# Patient Record
Sex: Female | Born: 1955 | Hispanic: No | Marital: Married | Smoking: Never smoker
Health system: Southern US, Community
[De-identification: ages and names within clinical notes are randomized; demographics above are authoritative.]

## PROBLEM LIST (undated history)

## (undated) DIAGNOSIS — M810 Age-related osteoporosis without current pathological fracture: Secondary | ICD-10-CM

## (undated) DIAGNOSIS — E039 Hypothyroidism, unspecified: Secondary | ICD-10-CM

## (undated) DIAGNOSIS — E785 Hyperlipidemia, unspecified: Secondary | ICD-10-CM

## (undated) DIAGNOSIS — Z8639 Personal history of other endocrine, nutritional and metabolic disease: Secondary | ICD-10-CM

## (undated) HISTORY — DX: Personal history of other endocrine, nutritional and metabolic disease: Z86.39

## (undated) HISTORY — DX: Hyperlipidemia, unspecified: E78.5

## (undated) HISTORY — DX: Hypothyroidism, unspecified: E03.9

## (undated) HISTORY — DX: Age-related osteoporosis without current pathological fracture: M81.0

---

## 1997-08-20 ENCOUNTER — Other Ambulatory Visit: Admission: RE | Admit: 1997-08-20 | Discharge: 1997-08-20 | Payer: Self-pay | Admitting: Obstetrics and Gynecology

## 1998-08-23 ENCOUNTER — Other Ambulatory Visit: Admission: RE | Admit: 1998-08-23 | Discharge: 1998-08-23 | Payer: Self-pay | Admitting: Obstetrics and Gynecology

## 1999-09-08 ENCOUNTER — Other Ambulatory Visit: Admission: RE | Admit: 1999-09-08 | Discharge: 1999-09-08 | Payer: Self-pay | Admitting: Obstetrics and Gynecology

## 2000-10-11 ENCOUNTER — Other Ambulatory Visit: Admission: RE | Admit: 2000-10-11 | Discharge: 2000-10-11 | Payer: Self-pay | Admitting: Obstetrics and Gynecology

## 2006-05-21 ENCOUNTER — Encounter: Admission: RE | Admit: 2006-05-21 | Discharge: 2006-05-21 | Payer: Self-pay | Admitting: Endocrinology

## 2006-06-04 ENCOUNTER — Encounter: Admission: RE | Admit: 2006-06-04 | Discharge: 2006-06-04 | Payer: Self-pay | Admitting: Endocrinology

## 2008-01-05 IMAGING — NM NM RAI THERAPY FOR HYPERTHYROIDISM
1 series · 1 of 1 positions shown · non-contrast
Comparison: none

CLINICAL DATA: Graves disease.  
 RADIOACTIVE IODINE THERAPY FOR HYPERTHYROIDISM:
 Procedure:  The risks and benefits of radioactive iodine therapy were discussed with the patient in detail.  Alternative therapies were also mentioned.  Radiation safety was discussed with the patient, including how to protect the general public from exposure.  There were no barriers to communication.  Written consent was obtained.  The patient then received a capsule containing the radiopharmaceutical.  ([id number])
 The patient will follow-up with the referring physician.
 Radiopharmaceutical:  15.24 mCi D-RNR sodium iodide.

[st statics, dual detec · 1 of 1 slices shown]
[im 1/1]
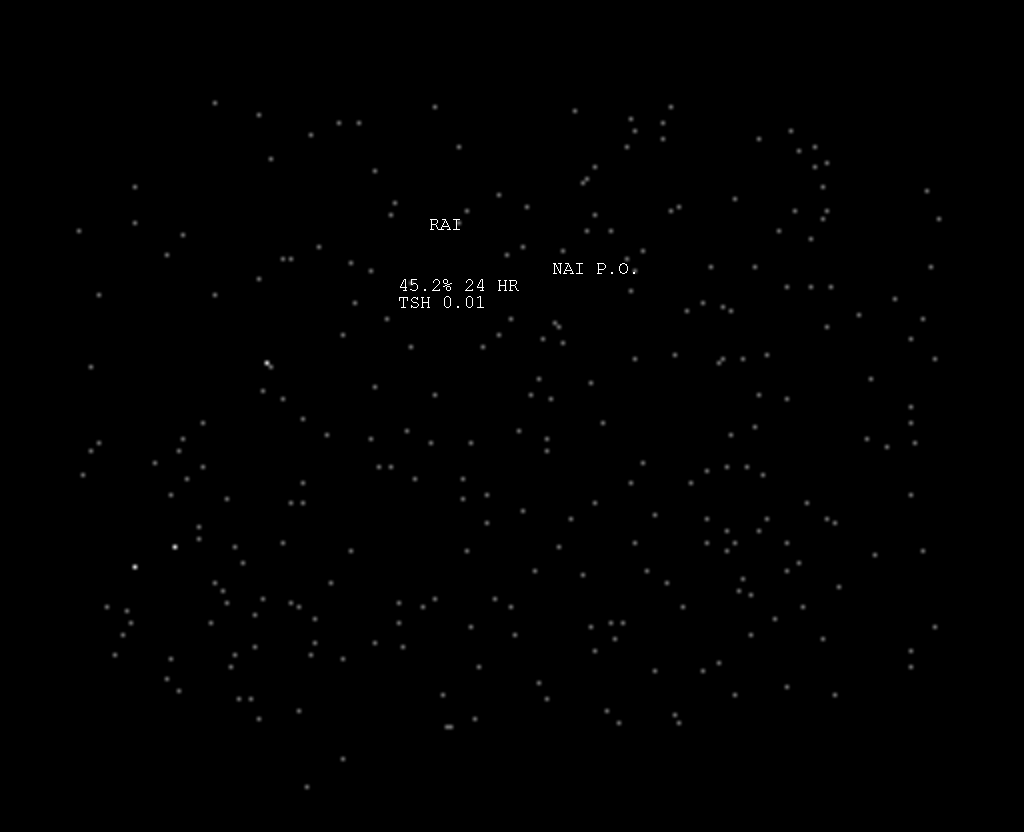

[1 of 1 positions shown; findings below may reference images not displayed]

IMPRESSION: Per oral administration of D-RNR sodium iodide for the treatment of hyperthyroidism.

## 2015-06-04 DIAGNOSIS — E039 Hypothyroidism, unspecified: Secondary | ICD-10-CM | POA: Diagnosis not present

## 2015-06-04 DIAGNOSIS — Z0000 Encounter for general adult medical examination without abnormal findings: Secondary | ICD-10-CM | POA: Diagnosis not present

## 2015-06-04 DIAGNOSIS — Z209 Contact with and (suspected) exposure to unspecified communicable disease: Secondary | ICD-10-CM | POA: Diagnosis not present

## 2015-06-04 DIAGNOSIS — E782 Mixed hyperlipidemia: Secondary | ICD-10-CM | POA: Diagnosis not present

## 2015-07-01 DIAGNOSIS — M8589 Other specified disorders of bone density and structure, multiple sites: Secondary | ICD-10-CM | POA: Diagnosis not present

## 2015-07-16 DIAGNOSIS — Z23 Encounter for immunization: Secondary | ICD-10-CM | POA: Diagnosis not present

## 2015-08-25 DIAGNOSIS — R079 Chest pain, unspecified: Secondary | ICD-10-CM | POA: Diagnosis not present

## 2015-09-08 DIAGNOSIS — Z01419 Encounter for gynecological examination (general) (routine) without abnormal findings: Secondary | ICD-10-CM | POA: Diagnosis not present

## 2015-09-08 DIAGNOSIS — Z681 Body mass index (BMI) 19 or less, adult: Secondary | ICD-10-CM | POA: Diagnosis not present

## 2015-09-08 DIAGNOSIS — Z1231 Encounter for screening mammogram for malignant neoplasm of breast: Secondary | ICD-10-CM | POA: Diagnosis not present

## 2015-09-27 DIAGNOSIS — Z86010 Personal history of colonic polyps: Secondary | ICD-10-CM | POA: Diagnosis not present

## 2015-09-27 DIAGNOSIS — K921 Melena: Secondary | ICD-10-CM | POA: Diagnosis not present

## 2015-09-27 DIAGNOSIS — K5900 Constipation, unspecified: Secondary | ICD-10-CM | POA: Diagnosis not present

## 2015-11-15 DIAGNOSIS — Z86010 Personal history of colonic polyps: Secondary | ICD-10-CM | POA: Diagnosis not present

## 2015-11-15 DIAGNOSIS — R194 Change in bowel habit: Secondary | ICD-10-CM | POA: Diagnosis not present

## 2015-11-15 DIAGNOSIS — K921 Melena: Secondary | ICD-10-CM | POA: Diagnosis not present

## 2015-12-06 DIAGNOSIS — Z23 Encounter for immunization: Secondary | ICD-10-CM | POA: Diagnosis not present

## 2016-01-06 DIAGNOSIS — L509 Urticaria, unspecified: Secondary | ICD-10-CM | POA: Diagnosis not present

## 2016-01-21 DIAGNOSIS — Z23 Encounter for immunization: Secondary | ICD-10-CM | POA: Diagnosis not present

## 2016-06-09 DIAGNOSIS — M859 Disorder of bone density and structure, unspecified: Secondary | ICD-10-CM | POA: Diagnosis not present

## 2016-06-09 DIAGNOSIS — E782 Mixed hyperlipidemia: Secondary | ICD-10-CM | POA: Diagnosis not present

## 2016-06-09 DIAGNOSIS — E039 Hypothyroidism, unspecified: Secondary | ICD-10-CM | POA: Diagnosis not present

## 2016-06-09 DIAGNOSIS — K635 Polyp of colon: Secondary | ICD-10-CM | POA: Diagnosis not present

## 2016-06-09 DIAGNOSIS — Z23 Encounter for immunization: Secondary | ICD-10-CM | POA: Diagnosis not present

## 2016-06-09 DIAGNOSIS — Z0000 Encounter for general adult medical examination without abnormal findings: Secondary | ICD-10-CM | POA: Diagnosis not present

## 2016-09-15 DIAGNOSIS — Z124 Encounter for screening for malignant neoplasm of cervix: Secondary | ICD-10-CM | POA: Diagnosis not present

## 2016-09-15 DIAGNOSIS — Z1231 Encounter for screening mammogram for malignant neoplasm of breast: Secondary | ICD-10-CM | POA: Diagnosis not present

## 2016-09-15 DIAGNOSIS — Z681 Body mass index (BMI) 19 or less, adult: Secondary | ICD-10-CM | POA: Diagnosis not present

## 2016-09-15 DIAGNOSIS — Z01419 Encounter for gynecological examination (general) (routine) without abnormal findings: Secondary | ICD-10-CM | POA: Diagnosis not present

## 2016-09-23 DIAGNOSIS — Z23 Encounter for immunization: Secondary | ICD-10-CM | POA: Diagnosis not present

## 2016-09-27 DIAGNOSIS — Z23 Encounter for immunization: Secondary | ICD-10-CM | POA: Diagnosis not present

## 2017-05-04 DIAGNOSIS — R05 Cough: Secondary | ICD-10-CM | POA: Diagnosis not present

## 2017-05-04 DIAGNOSIS — J069 Acute upper respiratory infection, unspecified: Secondary | ICD-10-CM | POA: Diagnosis not present

## 2017-07-04 DIAGNOSIS — E039 Hypothyroidism, unspecified: Secondary | ICD-10-CM | POA: Diagnosis not present

## 2017-07-04 DIAGNOSIS — E782 Mixed hyperlipidemia: Secondary | ICD-10-CM | POA: Diagnosis not present

## 2017-07-04 DIAGNOSIS — Z131 Encounter for screening for diabetes mellitus: Secondary | ICD-10-CM | POA: Diagnosis not present

## 2017-07-04 DIAGNOSIS — M859 Disorder of bone density and structure, unspecified: Secondary | ICD-10-CM | POA: Diagnosis not present

## 2017-07-04 DIAGNOSIS — Z0000 Encounter for general adult medical examination without abnormal findings: Secondary | ICD-10-CM | POA: Diagnosis not present

## 2017-08-06 DIAGNOSIS — M8588 Other specified disorders of bone density and structure, other site: Secondary | ICD-10-CM | POA: Diagnosis not present

## 2017-08-06 DIAGNOSIS — M810 Age-related osteoporosis without current pathological fracture: Secondary | ICD-10-CM | POA: Diagnosis not present

## 2017-10-05 DIAGNOSIS — Z681 Body mass index (BMI) 19 or less, adult: Secondary | ICD-10-CM | POA: Diagnosis not present

## 2017-10-05 DIAGNOSIS — Z01419 Encounter for gynecological examination (general) (routine) without abnormal findings: Secondary | ICD-10-CM | POA: Diagnosis not present

## 2017-10-05 DIAGNOSIS — Z1231 Encounter for screening mammogram for malignant neoplasm of breast: Secondary | ICD-10-CM | POA: Diagnosis not present

## 2017-10-10 ENCOUNTER — Other Ambulatory Visit: Payer: Self-pay | Admitting: Obstetrics and Gynecology

## 2017-10-10 DIAGNOSIS — R928 Other abnormal and inconclusive findings on diagnostic imaging of breast: Secondary | ICD-10-CM

## 2017-10-15 ENCOUNTER — Ambulatory Visit
Admission: RE | Admit: 2017-10-15 | Discharge: 2017-10-15 | Disposition: A | Discharge status: Home / Self Care | Payer: BLUE CROSS/BLUE SHIELD | Source: Ambulatory Visit | Attending: Obstetrics and Gynecology | Admitting: Obstetrics and Gynecology

## 2017-10-15 DIAGNOSIS — N6489 Other specified disorders of breast: Secondary | ICD-10-CM | POA: Diagnosis not present

## 2017-10-15 DIAGNOSIS — R928 Other abnormal and inconclusive findings on diagnostic imaging of breast: Secondary | ICD-10-CM

## 2017-10-19 DIAGNOSIS — N390 Urinary tract infection, site not specified: Secondary | ICD-10-CM | POA: Diagnosis not present

## 2017-12-17 DIAGNOSIS — Z23 Encounter for immunization: Secondary | ICD-10-CM | POA: Diagnosis not present

## 2017-12-17 DIAGNOSIS — M5416 Radiculopathy, lumbar region: Secondary | ICD-10-CM | POA: Diagnosis not present

## 2017-12-17 DIAGNOSIS — Z8739 Personal history of other diseases of the musculoskeletal system and connective tissue: Secondary | ICD-10-CM | POA: Diagnosis not present

## 2018-01-21 DIAGNOSIS — M545 Low back pain: Secondary | ICD-10-CM | POA: Diagnosis not present

## 2018-01-31 DIAGNOSIS — M545 Low back pain: Secondary | ICD-10-CM | POA: Diagnosis not present

## 2018-07-19 DIAGNOSIS — E039 Hypothyroidism, unspecified: Secondary | ICD-10-CM | POA: Diagnosis not present

## 2018-07-19 DIAGNOSIS — M810 Age-related osteoporosis without current pathological fracture: Secondary | ICD-10-CM | POA: Diagnosis not present

## 2018-10-28 DIAGNOSIS — E782 Mixed hyperlipidemia: Secondary | ICD-10-CM | POA: Diagnosis not present

## 2018-10-28 DIAGNOSIS — Z23 Encounter for immunization: Secondary | ICD-10-CM | POA: Diagnosis not present

## 2018-10-28 DIAGNOSIS — E039 Hypothyroidism, unspecified: Secondary | ICD-10-CM | POA: Diagnosis not present

## 2018-10-28 DIAGNOSIS — Z131 Encounter for screening for diabetes mellitus: Secondary | ICD-10-CM | POA: Diagnosis not present

## 2018-10-28 DIAGNOSIS — E559 Vitamin D deficiency, unspecified: Secondary | ICD-10-CM | POA: Diagnosis not present

## 2019-05-18 IMAGING — US US AXILLARY RIGHT
1 series · 5 of 5 positions shown · non-contrast
Comparison: October 05, 2017

CLINICAL DATA: 62-year-old patient recalled from recent screening
mammogram for evaluation of a possible mass in the right axillary
region.

EXAM:
ULTRASOUND OF THE RIGHT BREAST

[Series 1: us axillary right · 0.06mm/px · 5 of 5 slices shown]
[im 1/5]
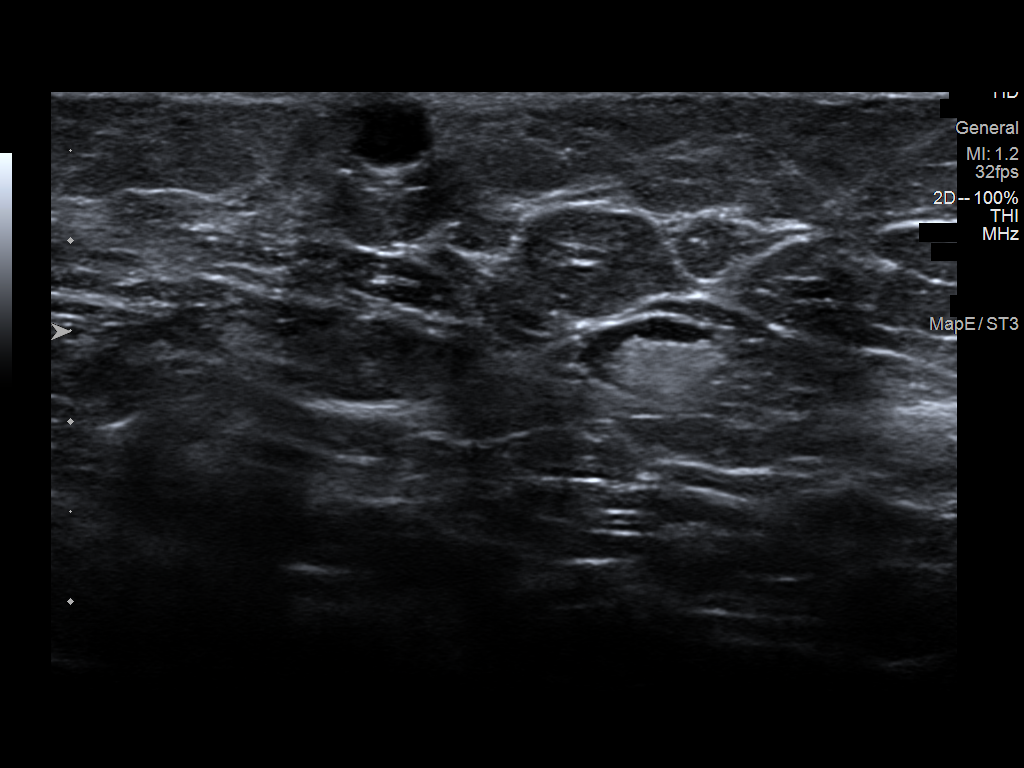
[im 2/5]
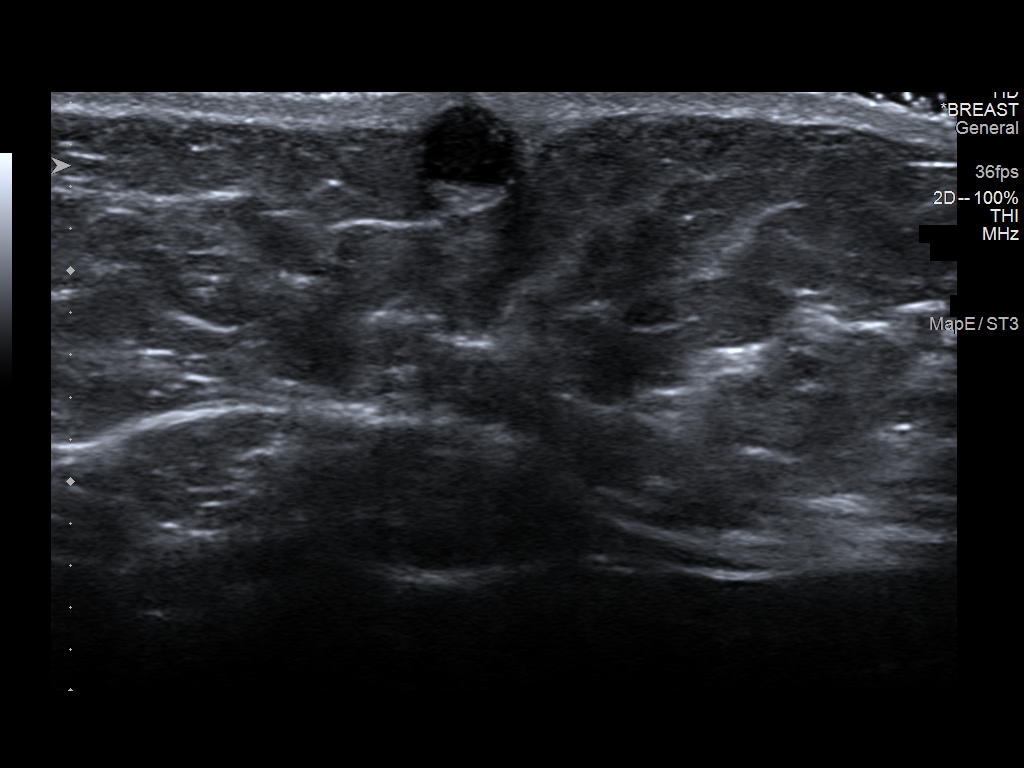
[im 3/5]
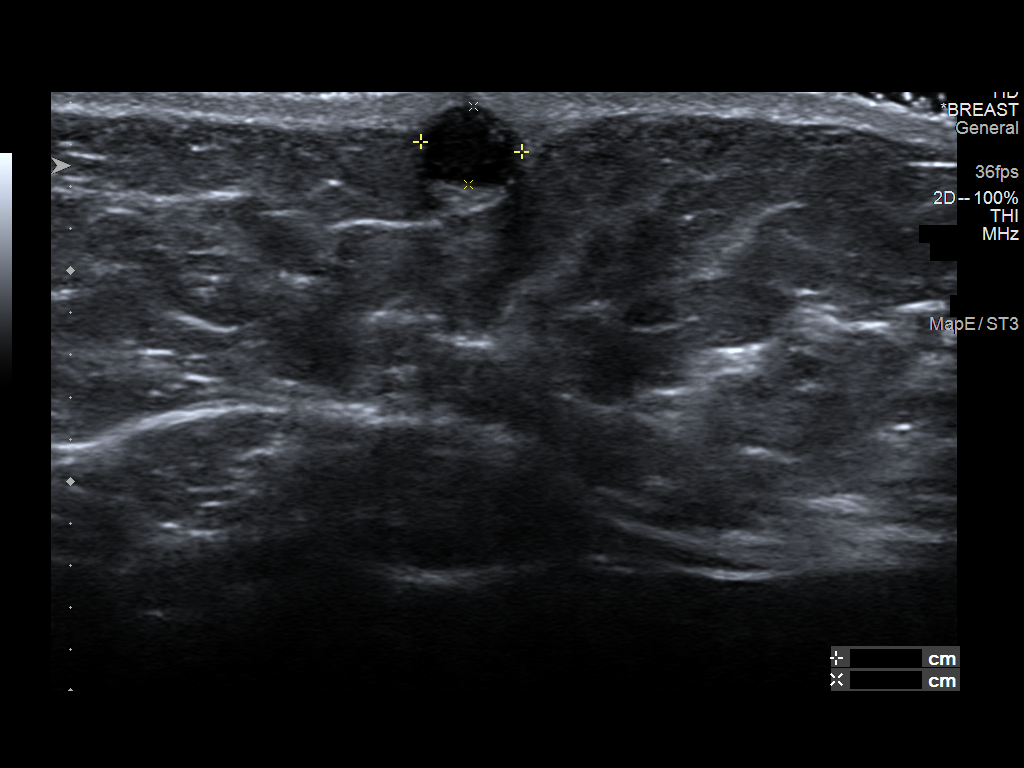
[im 4/5]
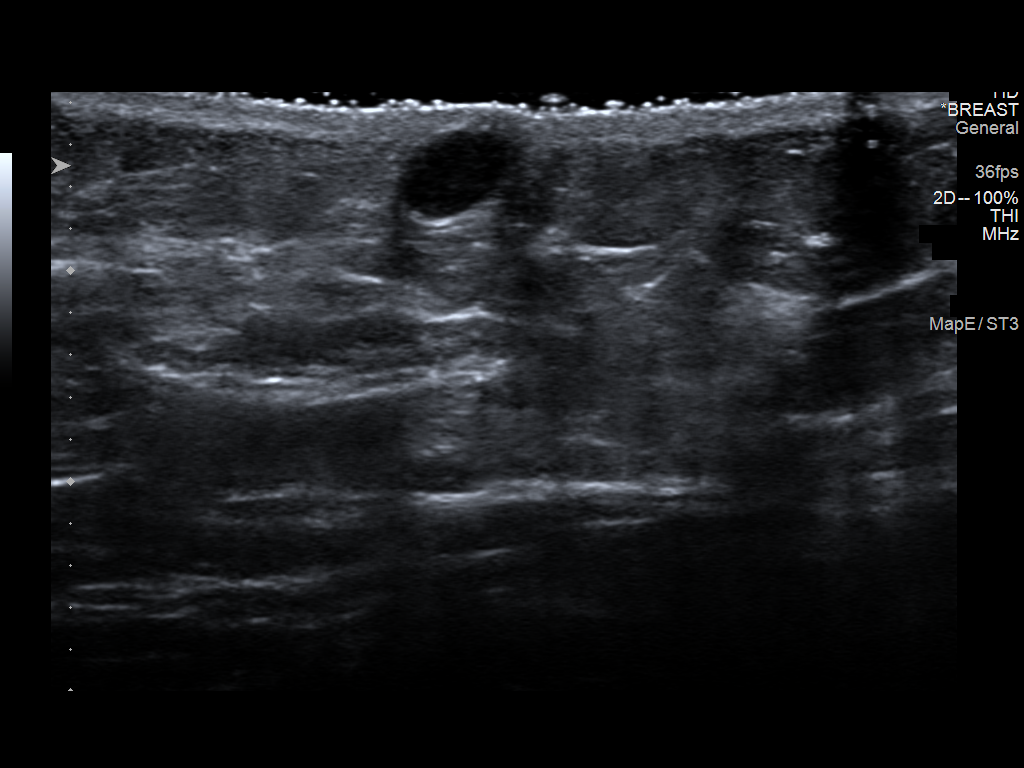
[im 5/5]
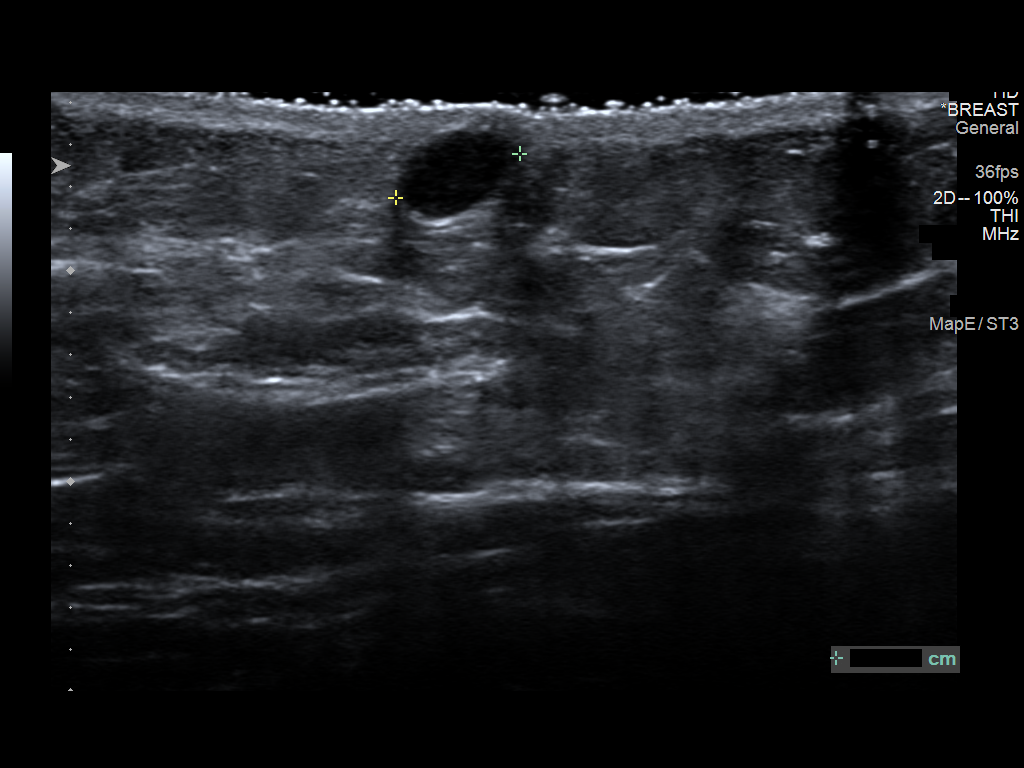

[5 of 5 positions shown; findings below may reference images not displayed]

FINDINGS: On physical exam, there is a superficial raised 5 mm cutaneous
lesion with a visible punctum, suggestive of benign sebaceous cyst.
This is in the inferior right axilla/axillary tail.

Targeted ultrasound is performed, showing mildly complex cyst
centered in the dermis measuring 0.5 x 0.4 x 0.6 cm. A small
hypoechoic tract is seen superficial to the cyst, within the dermis.
The patient states that this mass is chronic, and varies in size
over time. At times, white material has been expressed from the
dermal lesion.
IMPRESSION: Benign sebaceous cyst lower right axilla.

RECOMMENDATION:
Screening mammogram in one year.(Code:[id number])

I have discussed the findings and recommendations with the patient.
Results were also provided in writing at the conclusion of the
visit. If applicable, a reminder letter will be sent to the patient
regarding the next appointment.

BI-RADS CATEGORY  2: Benign.

## 2019-06-10 DIAGNOSIS — M25511 Pain in right shoulder: Secondary | ICD-10-CM | POA: Diagnosis not present

## 2019-08-27 ENCOUNTER — Other Ambulatory Visit: Payer: Self-pay | Admitting: Family Medicine

## 2019-08-27 DIAGNOSIS — M810 Age-related osteoporosis without current pathological fracture: Secondary | ICD-10-CM

## 2019-08-27 DIAGNOSIS — E559 Vitamin D deficiency, unspecified: Secondary | ICD-10-CM | POA: Diagnosis not present

## 2019-08-27 DIAGNOSIS — Z131 Encounter for screening for diabetes mellitus: Secondary | ICD-10-CM | POA: Diagnosis not present

## 2019-08-27 DIAGNOSIS — K635 Polyp of colon: Secondary | ICD-10-CM | POA: Diagnosis not present

## 2019-08-27 DIAGNOSIS — E039 Hypothyroidism, unspecified: Secondary | ICD-10-CM | POA: Diagnosis not present

## 2019-08-27 DIAGNOSIS — E782 Mixed hyperlipidemia: Secondary | ICD-10-CM | POA: Diagnosis not present

## 2019-08-27 DIAGNOSIS — Z0000 Encounter for general adult medical examination without abnormal findings: Secondary | ICD-10-CM | POA: Diagnosis not present

## 2019-08-27 DIAGNOSIS — R0789 Other chest pain: Secondary | ICD-10-CM | POA: Diagnosis not present

## 2019-09-04 DIAGNOSIS — Z681 Body mass index (BMI) 19 or less, adult: Secondary | ICD-10-CM | POA: Diagnosis not present

## 2019-09-04 DIAGNOSIS — Z124 Encounter for screening for malignant neoplasm of cervix: Secondary | ICD-10-CM | POA: Diagnosis not present

## 2019-09-04 DIAGNOSIS — Z01419 Encounter for gynecological examination (general) (routine) without abnormal findings: Secondary | ICD-10-CM | POA: Diagnosis not present

## 2019-09-19 ENCOUNTER — Ambulatory Visit: Payer: BC Managed Care – PPO | Admitting: Cardiology

## 2019-09-19 ENCOUNTER — Encounter: Payer: Self-pay | Admitting: Cardiology

## 2019-09-19 ENCOUNTER — Other Ambulatory Visit: Payer: Self-pay

## 2019-09-19 VITALS — BP 110/76 | HR 60 | Resp 15 | Ht 65.0 in | Wt 116.0 lb

## 2019-09-19 DIAGNOSIS — R0600 Dyspnea, unspecified: Secondary | ICD-10-CM | POA: Diagnosis not present

## 2019-09-19 DIAGNOSIS — E782 Mixed hyperlipidemia: Secondary | ICD-10-CM

## 2019-09-19 DIAGNOSIS — R9431 Abnormal electrocardiogram [ECG] [EKG]: Secondary | ICD-10-CM

## 2019-09-19 DIAGNOSIS — E890 Postprocedural hypothyroidism: Secondary | ICD-10-CM

## 2019-09-19 DIAGNOSIS — R072 Precordial pain: Secondary | ICD-10-CM

## 2019-09-19 DIAGNOSIS — R0609 Other forms of dyspnea: Secondary | ICD-10-CM

## 2019-09-19 MED ORDER — NITROGLYCERIN 0.4 MG SL SUBL: 0.4000 mg | SUBLINGUAL_TABLET | SUBLINGUAL | 0 refills | Status: AC | PRN

## 2019-09-19 MED ORDER — METOPROLOL TARTRATE 25 MG PO TABS: 12.5000 mg | ORAL_TABLET | Freq: Two times a day (BID) | ORAL | 0 refills | Status: DC

## 2019-09-19 MED ORDER — ASPIRIN EC 81 MG PO TBEC: 81.0000 mg | DELAYED_RELEASE_TABLET | Freq: Every day | ORAL | 3 refills | Status: DC

## 2019-09-19 NOTE — Progress Notes (Signed)
Date:  09/19/2019   ID:  Preslyn Violet BaldySim Fergeson, DOB 1955-02-11, MRN [id number]  PCP:  Carrington ClampHorvath, Michelle, MD  Cardiologist:  Tessa LernerSunit Damiano Stamper, DO, [facility] (established care 09/19/2019)  REASON FOR CONSULT: Atypical Chest pain with exertion.  REQUESTING PHYSICIAN:  Carrington ClampHorvath, Michelle, MD [address] [place] [id number]  Chief Complaint  Patient presents with  . New Patient (Initial Visit)  . Chest Pain    HPI  Theo Sim Modesto CharonWong is a 64 y.o. female who presents to the office with a chief complaint of " chest pain." She is referred to the office at the request of Carrington ClampHorvath, Michelle, MD. Patient's past medical history and cardiovascular risk factors include: Mixed hyperlipidemia, history of Graves' disease status post radioactive iodine ablation hypothyroidism, postmenopausal female.  Patient is referred to the office for evaluation of chest discomfort.  Patient states that she is been having chest discomfort for at least 1 year now but the episodes initially were once or twice a month.  However recently they have been more progressive.  She is feels a sharp sensation above and below her left breast, 6 out of 10 intensity, last for about 10 minutes, self-limited.  At times it is brought on by effort related activities and does resolve with rest but not always.  Patient states that about a month and a half ago she was helping her husband mow the lawn and after pushing a mower for short distance she was diaphoretic.  She still did not seek medical attention at that time.  Patient does not take any medications when she has chest discomfort.  Her home blood pressures usually range between 110-125 mmHg and diastolic blood pressure usually in the 70mmHg range.  Patient continues to walk 3-4 times a week at a local park for 30 to 45 minutes.  Patient states that she still able to walk the same distance but it is taking her walk and not able to walk as fast as she did before.  Independently reviewed  the lipid profile received from her PCPs office results noted below.  Denies prior history of coronary artery disease, myocardial infarction, congestive heart failure, deep venous thrombosis, pulmonary embolism, stroke, transient ischemic attack.  FUNCTIONAL STATUS: Walks atleast 3 days a week at a local park for 30-1445minutes.   ALLERGIES: No Known Allergies  MEDICATION LIST PRIOR TO VISIT: Current Meds  Medication Sig  . alendronate (FOSAMAX) 70 MG tablet Take 70 mg by mouth once a week.  . Cholecalciferol (VITAMIN D3) 50 MCG (2000 UT) TABS Take by mouth in the morning and at bedtime.  Marland Kitchen. levothyroxine (SYNTHROID) 88 MCG tablet Take 88 mcg by mouth every morning.     PAST MEDICAL HISTORY: Past Medical History:  Diagnosis Date  . Hx of Graves' disease   . Hyperlipidemia   . Hypothyroidism   . Osteoporosis     PAST SURGICAL HISTORY: History reviewed. No pertinent surgical history.  FAMILY HISTORY: The patient family history is not on file.  SOCIAL HISTORY:  The patient  reports that she has never smoked. She has never used smokeless tobacco. She reports current drug use. Drug: Morphine. She reports that she does not drink alcohol.  REVIEW OF SYSTEMS: Review of Systems  Constitutional: Negative for chills and fever.  HENT: Negative for hoarse voice and nosebleeds.   Eyes: Negative for discharge, double vision and pain.  Cardiovascular: Positive for chest pain (see above. ) and dyspnea on exertion. Negative for claudication, leg swelling,  near-syncope, orthopnea, palpitations, paroxysmal nocturnal dyspnea and syncope.  Respiratory: Negative for hemoptysis and shortness of breath.   Musculoskeletal: Negative for muscle cramps and myalgias.  Gastrointestinal: Negative for abdominal pain, constipation, diarrhea, hematemesis, hematochezia, melena, nausea and vomiting.  Neurological: Negative for dizziness and light-headedness.   PHYSICAL EXAM: Vitals with BMI 09/19/2019  Height  5\' 5"   Weight 116 lbs  BMI 19.3  Systolic 110  Diastolic 76  Pulse 60   CONSTITUTIONAL: Well-developed and well-nourished. No acute distress.  SKIN: Skin is warm and dry. No rash noted. No cyanosis. No pallor. No jaundice HEAD: Normocephalic and atraumatic.  EYES: No scleral icterus MOUTH/THROAT: Moist oral membranes.  NECK: No JVD present. No thyromegaly noted. No carotid bruits  LYMPHATIC: No visible cervical adenopathy.  CHEST Normal respiratory effort. No intercostal retractions  LUNGS: Clear to auscultation bilaterally.  No stridor. No wheezes. No rales.  CARDIOVASCULAR: Regular rate and rhythm, positive S1-S2, no murmurs rubs or gallops appreciated ABDOMINAL: No apparent ascites.  EXTREMITIES: No peripheral edema  HEMATOLOGIC: No significant bruising NEUROLOGIC: Oriented to person, place, and time. Nonfocal. Normal muscle tone.  PSYCHIATRIC: Normal mood and affect. Normal behavior. Cooperative  CARDIAC DATABASE: EKG: 09/19/2019: Sinus bradycardia, 58 bpm normal axis, old anterior infarct, T wave inversions anteroseptal leads suggestive of underlying ischemia, without underlying injury pattern.  Echocardiogram: None  Stress Testing: None  Heart Catheterization: None  LABORATORY DATA: No flowsheet data found.  No flowsheet data found.  Lipid Panel  No results found for: CHOL, TRIG, HDL, CHOLHDL, VLDL, LDLCALC, LDLDIRECT, LABVLDL  No components found for: NTPROBNP No results for input(s): PROBNP in the last 8760 hours. No results for input(s): TSH in the last 8760 hours.  BMP No results for input(s): NA, K, CL, CO2, GLUCOSE, BUN, CREATININE, CALCIUM, GFRNONAA, GFRAA in the last 8760 hours.  HEMOGLOBIN A1C No results found for: HGBA1C, MPG   External Labs: Collected: August 27, 2019 Lipid profile: Total cholesterol 217, triglycerides 156, HDL 51, LDL 138, non-HDL 166 TSH: August 29, 2019  IMPRESSION:    ICD-10-CM   1. Precordial chest pain  R07.2 EKG 12-Lead    PCV  ECHOCARDIOGRAM COMPLETE    metoprolol tartrate (LOPRESSOR) 25 MG tablet    aspirin EC 81 MG tablet    nitroGLYCERIN (NITROSTAT) 0.4 MG SL tablet    Basic metabolic panel  2. Dyspnea on exertion  R06.00 PCV ECHOCARDIOGRAM COMPLETE    CT CORONARY MORPH W/CTA COR W/SCORE W/CA W/CM &/OR WO/CM    CT CORONARY FRACTIONAL FLOW RESERVE DATA PREP    CT CORONARY FRACTIONAL FLOW RESERVE FLUID ANALYSIS    metoprolol tartrate (LOPRESSOR) 25 MG tablet    aspirin EC 81 MG tablet    nitroGLYCERIN (NITROSTAT) 0.4 MG SL tablet    Basic metabolic panel  3. Nonspecific abnormal electrocardiogram (ECG) (EKG)  R94.31   4. 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 female whose past medical history and cardiac risk factors include: Mixed hyperlipidemia, history of Graves' disease status post radioactive iodine ablation hypothyroidism, postmenopausal female.  Precordial chest pain:  Patient symptoms of chest discomfort has both typical and atypical features.  At times she also has effort related dyspnea.  She had one episode of diaphoresis with exertion more than a month ago.  EKG shows sinus bradycardia with possible old anterior infarct with possible anteroseptal ischemia.  Echocardiogram will be ordered to evaluate for structural heart disease and  left ventricular systolic function.  Patient will be scheduled for coronary CTA with possible FFR.  No active chest pain at today's office visit or anginal equivalent.  Patient is asked to seek medical attention to the closest ER via EMS if her symptoms increase in intensity, frequency, duration or has typical chest pain as discussed at today's visit.  Patient verbalizes understanding.  For now we will start aspirin 81 mg p.o. daily until the work-up is complete.  Patient also been prescribed sublingual nitroglycerin tablets to use on a as needed basis.  Patient's ventricular rate is  already very well controlled.  Prior to coronary CTA recommend Lopressor 12.5 mg p.o. twice daily to be started 2 days prior to the study.  Check BMP prior to CT scan.  Dyspnea on exertion: See above  Mixed hyperlipidemia:  Patient is currently not on statin therapy and wishes not to initiate pharmacological therapy.  Estimated 10-year risk of ASCVD less than 5%.  Educated on the importance of dietary changes.  FINAL MEDICATION LIST END OF ENCOUNTER: Meds ordered this encounter  Medications  . metoprolol tartrate (LOPRESSOR) 25 MG tablet    Sig: Take 0.5 tablets (12.5 mg total) by mouth 2 (two) times daily for 6 days. Hold if systolic blood pressure (top blood pressure number) less than 100 mmHg or heart rate less than 60 bpm (pulse).    Dispense:  6 tablet    Refill:  0  . aspirin EC 81 MG tablet    Sig: Take 1 tablet (81 mg total) by mouth daily. Swallow whole.    Dispense:  90 tablet    Refill:  3  . nitroGLYCERIN (NITROSTAT) 0.4 MG SL tablet    Sig: Place 1 tablet (0.4 mg total) under the tongue every 5 (five) minutes as needed for chest pain. If you require more than two tablets five minutes apart go to the nearest ER via EMS.    Dispense:  30 tablet    Refill:  0    There are no discontinued medications.   Current Outpatient Medications:  .  alendronate (FOSAMAX) 70 MG tablet, Take 70 mg by mouth once a week., Disp: , Rfl:  .  Cholecalciferol (VITAMIN D3) 50 MCG (2000 UT) TABS, Take by mouth in the morning and at bedtime., Disp: , Rfl:  .  levothyroxine (SYNTHROID) 88 MCG tablet, Take 88 mcg by mouth every morning., Disp: , Rfl:  .  aspirin EC 81 MG tablet, Take 1 tablet (81 mg total) by mouth daily. Swallow whole., Disp: 90 tablet, Rfl: 3 .  metoprolol tartrate (LOPRESSOR) 25 MG tablet, Take 0.5 tablets (12.5 mg total) by mouth 2 (two) times daily for 6 days. Hold if systolic blood pressure (top blood pressure number) less than 100 mmHg or heart rate less than 60 bpm  (pulse)., Disp: 6 tablet, Rfl: 0 .  nitroGLYCERIN (NITROSTAT) 0.4 MG SL tablet, Place 1 tablet (0.4 mg total) under the tongue every 5 (five) minutes as needed for chest pain. If you require more than two tablets five minutes apart go to the nearest ER via EMS., Disp: 30 tablet, Rfl: 0  Orders Placed This Encounter  Procedures  . CT CORONARY MORPH W/CTA COR W/SCORE W/CA W/CM &/OR WO/CM  . CT CORONARY FRACTIONAL FLOW RESERVE DATA PREP  . CT CORONARY FRACTIONAL FLOW RESERVE FLUID ANALYSIS  . Basic metabolic panel  . EKG 12-Lead  . PCV ECHOCARDIOGRAM COMPLETE    There are no Patient Instructions on file for this  visit.   --Continue cardiac medications as reconciled in final medication list. --Return in about 4 weeks (around 10/17/2019) for re-evaluation of symptoms of chest pain. , review test results.. Or sooner if needed. --Continue follow-up with your primary care physician regarding the management of your other chronic comorbid conditions.  Patient's questions and concerns were addressed to her satisfaction. She voices understanding of the instructions provided during this encounter.   This note was created using a voice recognition software as a result there may be grammatical errors inadvertently enclosed that do not reflect the nature of this encounter. Every attempt is made to correct such errors.  Tessa Lerner, [place], [facility]  Pager: [phone] Office: [phone]

## 2019-09-20 LAB — BASIC METABOLIC PANEL
BUN/Creatinine Ratio: 41 — ABNORMAL HIGH (ref 12–28)
BUN: 15 mg/dL (ref 8–27)
CO2: 27 mmol/L (ref 20–29)
Calcium: 9.2 mg/dL (ref 8.7–10.3)
Chloride: 101 mmol/L (ref 96–106)
Creatinine, Ser: 0.37 mg/dL — ABNORMAL LOW (ref 0.57–1.00)
GFR calc Af Amer: 132 mL/min/{1.73_m2} (ref >59)
GFR calc non Af Amer: 115 mL/min/{1.73_m2} (ref >59)
Glucose: 97 mg/dL (ref 65–99)
Potassium: 4.2 mmol/L (ref 3.5–5.2)
Sodium: 139 mmol/L (ref 134–144)

## 2019-09-23 ENCOUNTER — Other Ambulatory Visit: Payer: Self-pay | Admitting: Cardiology

## 2019-09-23 ENCOUNTER — Telehealth (HOSPITAL_COMMUNITY): Payer: Self-pay | Admitting: Emergency Medicine

## 2019-09-23 DIAGNOSIS — R0600 Dyspnea, unspecified: Secondary | ICD-10-CM

## 2019-09-23 DIAGNOSIS — R9431 Abnormal electrocardiogram [ECG] [EKG]: Secondary | ICD-10-CM

## 2019-09-23 DIAGNOSIS — R0609 Other forms of dyspnea: Secondary | ICD-10-CM

## 2019-09-23 NOTE — Telephone Encounter (Signed)
Attempted to call patient regarding upcoming cardiac CT appointment. °Left message on voicemail with name and callback number °Kahron Kauth RN Navigator Cardiac Imaging °[facility] °[phone] Office °[phone] Cell ° °

## 2019-09-23 NOTE — Progress Notes (Signed)
Left voicemail for patient to call back for lab results.   Copy of labs sent to PCP.

## 2019-09-25 ENCOUNTER — Ambulatory Visit (HOSPITAL_COMMUNITY): Payer: BC Managed Care – PPO

## 2019-10-03 ENCOUNTER — Other Ambulatory Visit: Payer: BC Managed Care – PPO

## 2019-10-17 ENCOUNTER — Ambulatory Visit: Payer: BC Managed Care – PPO | Admitting: Cardiology

## 2019-10-20 ENCOUNTER — Other Ambulatory Visit: Payer: BC Managed Care – PPO

## 2019-10-23 ENCOUNTER — Ambulatory Visit: Payer: BC Managed Care – PPO | Admitting: Cardiology

## 2019-11-19 ENCOUNTER — Ambulatory Visit
Admission: RE | Admit: 2019-11-19 | Discharge: 2019-11-19 | Disposition: A | Discharge status: Home / Self Care | Payer: BC Managed Care – PPO | Source: Ambulatory Visit | Attending: Family Medicine | Admitting: Family Medicine

## 2019-11-19 ENCOUNTER — Other Ambulatory Visit: Payer: Self-pay

## 2019-11-19 DIAGNOSIS — Z780 Asymptomatic menopausal state: Secondary | ICD-10-CM | POA: Diagnosis not present

## 2019-11-19 DIAGNOSIS — M810 Age-related osteoporosis without current pathological fracture: Secondary | ICD-10-CM

## 2019-11-19 DIAGNOSIS — M8589 Other specified disorders of bone density and structure, multiple sites: Secondary | ICD-10-CM | POA: Diagnosis not present

## 2019-11-21 DIAGNOSIS — H5203 Hypermetropia, bilateral: Secondary | ICD-10-CM | POA: Diagnosis not present

## 2019-11-21 DIAGNOSIS — H25013 Cortical age-related cataract, bilateral: Secondary | ICD-10-CM | POA: Diagnosis not present

## 2019-11-24 ENCOUNTER — Other Ambulatory Visit: Payer: Self-pay

## 2019-11-24 ENCOUNTER — Ambulatory Visit: Payer: BC Managed Care – PPO

## 2019-11-24 DIAGNOSIS — R0609 Other forms of dyspnea: Secondary | ICD-10-CM

## 2019-11-24 DIAGNOSIS — R9431 Abnormal electrocardiogram [ECG] [EKG]: Secondary | ICD-10-CM

## 2019-11-24 DIAGNOSIS — R0600 Dyspnea, unspecified: Secondary | ICD-10-CM

## 2019-11-25 ENCOUNTER — Ambulatory Visit: Payer: BC Managed Care – PPO

## 2019-11-25 DIAGNOSIS — R0600 Dyspnea, unspecified: Secondary | ICD-10-CM

## 2019-11-25 DIAGNOSIS — R072 Precordial pain: Secondary | ICD-10-CM

## 2019-11-25 DIAGNOSIS — R0609 Other forms of dyspnea: Secondary | ICD-10-CM | POA: Diagnosis not present

## 2019-12-01 NOTE — Progress Notes (Signed)
Called pt no answer, left a vm about results.

## 2019-12-08 ENCOUNTER — Ambulatory Visit: Payer: BC Managed Care – PPO | Admitting: Cardiology

## 2019-12-08 ENCOUNTER — Encounter: Payer: Self-pay | Admitting: Cardiology

## 2019-12-08 ENCOUNTER — Other Ambulatory Visit: Payer: Self-pay

## 2019-12-08 VITALS — BP 106/62 | HR 66 | Resp 15 | Ht 65.0 in | Wt 115.0 lb

## 2019-12-08 DIAGNOSIS — R072 Precordial pain: Secondary | ICD-10-CM

## 2019-12-08 DIAGNOSIS — Z712 Person consulting for explanation of examination or test findings: Secondary | ICD-10-CM

## 2019-12-08 DIAGNOSIS — E782 Mixed hyperlipidemia: Secondary | ICD-10-CM

## 2019-12-08 NOTE — Progress Notes (Signed)
Date:  12/08/2019   ID:  Denise Rose, DOB 02/01/1956, MRN [id number]  PCP:  Carrington Clamp, MD  Cardiologist:  Tessa Lerner, DO, [facility] (established care 09/19/2019)  Date: 12/08/19 Last Office Visit: 09/19/2019  Chief Complaint  Patient presents with  . Follow-up    4 week  . re-evaluation of CP  . Results    HPI  Denise Rose is a 64 y.o. female who presents to the office with a chief complaint of " reevaluation of chest pain and review test results." She is referred to the office at the request of Carrington Clamp, MD. Patient's past medical history and cardiovascular risk factors include: Mixed hyperlipidemia, history of Graves' disease status post radioactive iodine ablation hypothyroidism, postmenopausal female.  Patient is referred to the office for evaluation of chest discomfort.  At the last office visit patient noted symptoms of precordial chest pain which had both typical and atypical features in addition to effort related dyspnea.  She was recommended to undergo coronary CTA with possible CT FFR.  However, due to limitations from her insurance company she did not undergo coronary CTA.  She underwent echocardiogram and stress test since last office visit.  Clinically patient states that she is doing well from the last visit.  She has not had recurrence of chest pain or shortness of breath either at rest or with walking.  She has not required the use of sublingual nitroglycerin tablets.  Echocardiogram noted preserved LVEF with mild valvular heart disease.  An exercise nuclear stress test noted normal myocardial perfusion.  Denies prior history of coronary artery disease, myocardial infarction, congestive heart failure, deep venous thrombosis, pulmonary embolism, stroke, transient ischemic attack.  FUNCTIONAL STATUS: [place]least 3 [place] for [place]-24minutes.   ALLERGIES: No Known Allergies  MEDICATION LIST PRIOR TO VISIT: Current Meds  Medication  Sig  . alendronate (FOSAMAX) 70 MG tablet Take 70 mg by mouth once a week.  . Calcium Carb-Cholecalciferol (CALCIUM 500 + D) 500-125 MG-UNIT TABS Take 600 mg by mouth.  . Cholecalciferol (VITAMIN D3) 50 MCG (2000 UT) TABS Take by mouth in the morning and at bedtime.  Marland Kitchen levothyroxine (SYNTHROID) 88 MCG tablet Take 88 mcg by mouth every morning.  . nitroGLYCERIN (NITROSTAT) 0.4 MG SL tablet Place 1 tablet (0.4 mg total) under the tongue every 5 (five) minutes as needed for chest pain. If you require more than two tablets five minutes apart go to the nearest ER via EMS.     PAST MEDICAL HISTORY: Past Medical History:  Diagnosis Date  . Hx of Graves' disease   . Hyperlipidemia   . Hypothyroidism   . Osteoporosis     PAST SURGICAL HISTORY: History reviewed. No pertinent surgical history. None   FAMILY HISTORY: The patient family history is not on file. No family history of premature coronary disease or sudden cardiac death.  SOCIAL HISTORY:  The patient  reports that she has never smoked. She has never used smokeless tobacco. She reports current drug use. Drug: Morphine. She reports that she does not drink alcohol.  REVIEW OF SYSTEMS: Review of Systems  Constitutional: Negative for chills and fever.  HENT: Negative for hoarse voice and nosebleeds.   Eyes: Negative for discharge, double vision and pain.  Cardiovascular: Negative for chest pain, claudication, dyspnea on exertion, leg swelling, near-syncope, orthopnea, palpitations, paroxysmal nocturnal dyspnea and syncope.  Respiratory: Negative for hemoptysis and shortness of breath.   Musculoskeletal: Negative for muscle cramps  and myalgias.  Gastrointestinal: Negative for abdominal pain, constipation, diarrhea, hematemesis, hematochezia, melena, nausea and vomiting.  Neurological: Negative for dizziness and light-headedness.   PHYSICAL EXAM: Vitals with BMI 12/08/2019 09/19/2019  Height 5\' 5"  5\' 5"   Weight 115 lbs 116 lbs  BMI  19.14 19.3  Systolic 106 110  Diastolic 62 76  Pulse 66 60   CONSTITUTIONAL: Well-developed and well-nourished. No acute distress.  SKIN: Skin is warm and dry. No rash noted. No cyanosis. No pallor. No jaundice HEAD: Normocephalic and atraumatic.  EYES: No scleral icterus MOUTH/THROAT: Moist oral membranes.  NECK: No JVD present. No thyromegaly noted. No carotid bruits  LYMPHATIC: No visible cervical adenopathy.  CHEST Normal respiratory effort. No intercostal retractions  LUNGS: Clear to auscultation bilaterally.  No stridor. No wheezes. No rales.  CARDIOVASCULAR: Regular rate and rhythm, positive S1-S2, no murmurs rubs or gallops appreciated ABDOMINAL: No apparent ascites.  EXTREMITIES: No peripheral edema  HEMATOLOGIC: No significant bruising NEUROLOGIC: Oriented to person, place, and time. Nonfocal. Normal muscle tone.  PSYCHIATRIC: Normal mood and affect. Normal behavior. Cooperative  CARDIAC DATABASE: EKG: 09/19/2019: Sinus bradycardia, 58 bpm normal axis, old anterior infarct, T wave inversions anteroseptal leads suggestive of underlying ischemia, without underlying injury pattern.  Echocardiogram: 11/25/2019:  Left ventricle cavity is normal in size and thickness. Normal global wall motion. Normal LV systolic function with EF 55%. Normal diastolic filling pattern.  Mild (Grade I) mitral regurgitation.  Mild tricuspid regurgitation.  No evidence of pulmonary hypertension.   Stress Testing: Exercise Myoview stress test 11/24/2019:  Exercise nuclear stress test was performed using Bruce protocol. Patient reached 7 METS, and 86% of age predicted maximum heart rate. Exercise capacity was low. Chest pain not reported. Heart rate and hemodynamic response were normal. Stress EKG showed sinus tachycardia, 1.5-2 mm horizontal ST depression in leads II, III, aVF, <1 mm horizontal ST depression in leads V5,V6; normalizing 1 min into recovery.  Normal myocardial perfusion. Normal stress  LVEF.  Given low exercise capacity and EKG changes, recommend clinical correlation.   Heart Catheterization: None  LABORATORY DATA: No flowsheet data found.  CMP Latest Ref Rng & Units 09/19/2019  Glucose 65 - 99 mg/dL 97  BUN 8 - 27 mg/dL 15  Creatinine 11/26/2019 - 09/21/2019 mg/dL 1.00)  Sodium 7.12 - 1.97(J mmol/L 139  Potassium 3.5 - 5.2 mmol/L 4.2  Chloride 96 - 106 mmol/L 101  CO2 20 - 29 mmol/L 27  Calcium 8.7 - 10.3 mg/dL 9.2    Lipid Panel  No results found for: CHOL, TRIG, HDL, CHOLHDL, VLDL, LDLCALC, LDLDIRECT, LABVLDL  No components found for: NTPROBNP No results for input(s): PROBNP in the last 8760 hours. No results for input(s): TSH in the last 8760 hours.  BMP Recent Labs    09/19/19 1401  NA 139  K 4.2  CL 101  CO2 27  GLUCOSE 97  BUN 15  CREATININE 0.37*  CALCIUM 9.2  GFRNONAA 115  GFRAA 132    HEMOGLOBIN A1C No results found for: HGBA1C, MPG   External Labs: Collected: August 27, 2019 Lipid profile: Total cholesterol 217, triglycerides 156, HDL 51, LDL 138, non-HDL 166 TSH: 09/21/19  IMPRESSION:    ICD-10-CM   1. Precordial chest pain  R07.2   2. Encounter to discuss test results  Z71.2   3. Mixed hyperlipidemia  E78.2      RECOMMENDATIONS: Denise Rose is a 64 y.o. female whose past medical history and cardiac risk factors include: Mixed hyperlipidemia, history of  Graves' disease status post radioactive iodine ablation hypothyroidism, postmenopausal female.  Precordial chest pain: Resolved  She was originally scheduled for pulmonary CT plus or minus CT FFR; however, this was not approved by her insurance company.  She underwent an echocardiogram which noted preserved left ventricular systolic function without any significant valvular heart disease.  Stress test noted normal myocardial perfusion, patient exercised on Bruce protocol and achieved 7 METS and 86% of age-predicted maximum heart rate, and stress ECG noted ST-T changes which resolved within  1 minute into recovery.    Educated on the importance of improving her modifiable cardiovascular risk factors.  Encouraged the increase physical activity 30 minutes a day 5 days a week as tolerated.  Mixed hyperlipidemia:  Based on the lipid profile from August 27, 2019 patient's LDL is 138 and non-HDL 166.  She is currently working on lifestyle modifications to improve her lipid profile.  Currently managed by PCP.  Patient is asked to have a repeat lipid profile 6 months from her last study and if cholesterol levels if not improved may consider low-dose statin therapy.    May discuss with PCP in regards to undergoing a calcium score for further risk stratification.    Patient states that she will follow-up with PCP regarding the management of her hyperlipidemia.    FINAL MEDICATION LIST END OF ENCOUNTER: No orders of the defined types were placed in this encounter.   Current Outpatient Medications:  .  alendronate (FOSAMAX) 70 MG tablet, Take 70 mg by mouth once a week., Disp: , Rfl:  .  Calcium Carb-Cholecalciferol (CALCIUM 500 + D) 500-125 MG-UNIT TABS, Take 600 mg by mouth., Disp: , Rfl:  .  Cholecalciferol (VITAMIN D3) 50 MCG (2000 UT) TABS, Take by mouth in the morning and at bedtime., Disp: , Rfl:  .  levothyroxine (SYNTHROID) 88 MCG tablet, Take 88 mcg by mouth every morning., Disp: , Rfl:  .  nitroGLYCERIN (NITROSTAT) 0.4 MG SL tablet, Place 1 tablet (0.4 mg total) under the tongue every 5 (five) minutes as needed for chest pain. If you require more than two tablets five minutes apart go to the nearest ER via EMS., Disp: 30 tablet, Rfl: 0  No orders of the defined types were placed in this encounter.   There are no Patient Instructions on file for this visit.   --Continue cardiac medications as reconciled in final medication list. --Return in about 1 year (around 12/07/2020) for Primary prevention of CAD or as needed. . Or sooner if needed. --Continue follow-up with your  primary care physician regarding the management of your other chronic comorbid conditions.  Patient's questions and concerns were addressed to her satisfaction. She voices understanding of the instructions provided during this encounter.   This note was created using a voice recognition software as a result there may be grammatical errors inadvertently enclosed that do not reflect the nature of this encounter. Every attempt is made to correct such errors.  Tessa Lerner, [place], [facility]  Pager: [phone] Office: [phone]

## 2020-09-06 DIAGNOSIS — E559 Vitamin D deficiency, unspecified: Secondary | ICD-10-CM | POA: Diagnosis not present

## 2020-09-06 DIAGNOSIS — Z0000 Encounter for general adult medical examination without abnormal findings: Secondary | ICD-10-CM | POA: Diagnosis not present

## 2020-09-06 DIAGNOSIS — Z131 Encounter for screening for diabetes mellitus: Secondary | ICD-10-CM | POA: Diagnosis not present

## 2020-09-06 DIAGNOSIS — E782 Mixed hyperlipidemia: Secondary | ICD-10-CM | POA: Diagnosis not present

## 2020-09-06 DIAGNOSIS — K635 Polyp of colon: Secondary | ICD-10-CM | POA: Diagnosis not present

## 2020-09-06 DIAGNOSIS — Z13220 Encounter for screening for lipoid disorders: Secondary | ICD-10-CM | POA: Diagnosis not present

## 2020-09-06 DIAGNOSIS — M810 Age-related osteoporosis without current pathological fracture: Secondary | ICD-10-CM | POA: Diagnosis not present

## 2020-09-06 DIAGNOSIS — E039 Hypothyroidism, unspecified: Secondary | ICD-10-CM | POA: Diagnosis not present

## 2020-10-07 DIAGNOSIS — Z1231 Encounter for screening mammogram for malignant neoplasm of breast: Secondary | ICD-10-CM | POA: Diagnosis not present

## 2020-11-01 DIAGNOSIS — M542 Cervicalgia: Secondary | ICD-10-CM | POA: Diagnosis not present

## 2020-11-01 DIAGNOSIS — M5412 Radiculopathy, cervical region: Secondary | ICD-10-CM | POA: Diagnosis not present

## 2020-11-01 DIAGNOSIS — M25511 Pain in right shoulder: Secondary | ICD-10-CM | POA: Diagnosis not present

## 2020-11-02 DIAGNOSIS — Z01419 Encounter for gynecological examination (general) (routine) without abnormal findings: Secondary | ICD-10-CM | POA: Diagnosis not present

## 2020-11-10 DIAGNOSIS — M5412 Radiculopathy, cervical region: Secondary | ICD-10-CM | POA: Diagnosis not present

## 2020-12-01 DIAGNOSIS — Z1211 Encounter for screening for malignant neoplasm of colon: Secondary | ICD-10-CM | POA: Diagnosis not present

## 2020-12-01 DIAGNOSIS — D122 Benign neoplasm of ascending colon: Secondary | ICD-10-CM | POA: Diagnosis not present

## 2020-12-01 DIAGNOSIS — Z09 Encounter for follow-up examination after completed treatment for conditions other than malignant neoplasm: Secondary | ICD-10-CM | POA: Diagnosis not present

## 2020-12-01 DIAGNOSIS — Z86010 Personal history of colonic polyps: Secondary | ICD-10-CM | POA: Diagnosis not present

## 2020-12-01 DIAGNOSIS — K635 Polyp of colon: Secondary | ICD-10-CM | POA: Diagnosis not present

## 2020-12-01 DIAGNOSIS — K648 Other hemorrhoids: Secondary | ICD-10-CM | POA: Diagnosis not present

## 2021-07-08 DIAGNOSIS — R300 Dysuria: Secondary | ICD-10-CM | POA: Diagnosis not present

## 2021-09-19 DIAGNOSIS — Z13220 Encounter for screening for lipoid disorders: Secondary | ICD-10-CM | POA: Diagnosis not present

## 2021-09-19 DIAGNOSIS — M8580 Other specified disorders of bone density and structure, unspecified site: Secondary | ICD-10-CM | POA: Diagnosis not present

## 2021-09-19 DIAGNOSIS — E039 Hypothyroidism, unspecified: Secondary | ICD-10-CM | POA: Diagnosis not present

## 2021-09-19 DIAGNOSIS — Z86010 Personal history of colonic polyps: Secondary | ICD-10-CM | POA: Diagnosis not present

## 2021-09-19 DIAGNOSIS — Z0000 Encounter for general adult medical examination without abnormal findings: Secondary | ICD-10-CM | POA: Diagnosis not present

## 2021-09-20 ENCOUNTER — Other Ambulatory Visit: Payer: Self-pay | Admitting: Family Medicine

## 2021-09-20 DIAGNOSIS — M8580 Other specified disorders of bone density and structure, unspecified site: Secondary | ICD-10-CM

## 2021-10-12 DIAGNOSIS — H524 Presbyopia: Secondary | ICD-10-CM | POA: Diagnosis not present

## 2021-10-12 DIAGNOSIS — H25813 Combined forms of age-related cataract, bilateral: Secondary | ICD-10-CM | POA: Diagnosis not present

## 2021-10-12 DIAGNOSIS — H5203 Hypermetropia, bilateral: Secondary | ICD-10-CM | POA: Diagnosis not present

## 2021-11-03 DIAGNOSIS — Z01419 Encounter for gynecological examination (general) (routine) without abnormal findings: Secondary | ICD-10-CM | POA: Diagnosis not present

## 2021-11-03 DIAGNOSIS — Z1231 Encounter for screening mammogram for malignant neoplasm of breast: Secondary | ICD-10-CM | POA: Diagnosis not present

## 2021-11-03 DIAGNOSIS — Z124 Encounter for screening for malignant neoplasm of cervix: Secondary | ICD-10-CM | POA: Diagnosis not present

## 2022-01-18 DIAGNOSIS — M7541 Impingement syndrome of right shoulder: Secondary | ICD-10-CM | POA: Diagnosis not present

## 2022-03-20 ENCOUNTER — Other Ambulatory Visit: Payer: BC Managed Care – PPO

## 2022-04-10 DIAGNOSIS — M7711 Lateral epicondylitis, right elbow: Secondary | ICD-10-CM | POA: Diagnosis not present

## 2022-04-10 DIAGNOSIS — M7712 Lateral epicondylitis, left elbow: Secondary | ICD-10-CM | POA: Diagnosis not present

## 2022-05-31 DIAGNOSIS — R300 Dysuria: Secondary | ICD-10-CM | POA: Diagnosis not present

## 2022-08-07 ENCOUNTER — Ambulatory Visit
Admission: RE | Admit: 2022-08-07 | Discharge: 2022-08-07 | Disposition: A | Discharge status: Home / Self Care | Payer: Medicare Other | Source: Ambulatory Visit | Attending: Family Medicine | Admitting: Family Medicine

## 2022-08-07 DIAGNOSIS — M8580 Other specified disorders of bone density and structure, unspecified site: Secondary | ICD-10-CM

## 2022-08-07 DIAGNOSIS — M8588 Other specified disorders of bone density and structure, other site: Secondary | ICD-10-CM | POA: Diagnosis not present

## 2022-08-07 DIAGNOSIS — E349 Endocrine disorder, unspecified: Secondary | ICD-10-CM | POA: Diagnosis not present

## 2022-09-25 DIAGNOSIS — M8580 Other specified disorders of bone density and structure, unspecified site: Secondary | ICD-10-CM | POA: Diagnosis not present

## 2022-09-25 DIAGNOSIS — Z0000 Encounter for general adult medical examination without abnormal findings: Secondary | ICD-10-CM | POA: Diagnosis not present

## 2022-09-25 DIAGNOSIS — E039 Hypothyroidism, unspecified: Secondary | ICD-10-CM | POA: Diagnosis not present

## 2022-09-25 DIAGNOSIS — Z23 Encounter for immunization: Secondary | ICD-10-CM | POA: Diagnosis not present

## 2022-09-25 DIAGNOSIS — E782 Mixed hyperlipidemia: Secondary | ICD-10-CM | POA: Diagnosis not present

## 2022-09-25 DIAGNOSIS — E559 Vitamin D deficiency, unspecified: Secondary | ICD-10-CM | POA: Diagnosis not present

## 2022-10-18 DIAGNOSIS — H25813 Combined forms of age-related cataract, bilateral: Secondary | ICD-10-CM | POA: Diagnosis not present

## 2022-10-18 DIAGNOSIS — H5203 Hypermetropia, bilateral: Secondary | ICD-10-CM | POA: Diagnosis not present

## 2022-11-07 DIAGNOSIS — Z1231 Encounter for screening mammogram for malignant neoplasm of breast: Secondary | ICD-10-CM | POA: Diagnosis not present

## 2023-02-02 DIAGNOSIS — M25552 Pain in left hip: Secondary | ICD-10-CM | POA: Diagnosis not present

## 2023-02-28 DIAGNOSIS — H25013 Cortical age-related cataract, bilateral: Secondary | ICD-10-CM | POA: Diagnosis not present

## 2023-02-28 DIAGNOSIS — H5203 Hypermetropia, bilateral: Secondary | ICD-10-CM | POA: Diagnosis not present

## 2023-02-28 DIAGNOSIS — H52203 Unspecified astigmatism, bilateral: Secondary | ICD-10-CM | POA: Diagnosis not present

## 2023-02-28 DIAGNOSIS — H2513 Age-related nuclear cataract, bilateral: Secondary | ICD-10-CM | POA: Diagnosis not present

## 2023-02-28 DIAGNOSIS — H524 Presbyopia: Secondary | ICD-10-CM | POA: Diagnosis not present

## 2023-03-19 DIAGNOSIS — H25811 Combined forms of age-related cataract, right eye: Secondary | ICD-10-CM | POA: Diagnosis not present

## 2023-03-19 DIAGNOSIS — H268 Other specified cataract: Secondary | ICD-10-CM | POA: Diagnosis not present

## 2023-03-19 DIAGNOSIS — H2511 Age-related nuclear cataract, right eye: Secondary | ICD-10-CM | POA: Diagnosis not present

## 2023-03-19 DIAGNOSIS — H25011 Cortical age-related cataract, right eye: Secondary | ICD-10-CM | POA: Diagnosis not present

## 2023-04-01 DIAGNOSIS — R319 Hematuria, unspecified: Secondary | ICD-10-CM | POA: Diagnosis not present

## 2023-04-01 DIAGNOSIS — N390 Urinary tract infection, site not specified: Secondary | ICD-10-CM | POA: Diagnosis not present

## 2023-04-09 DIAGNOSIS — H25012 Cortical age-related cataract, left eye: Secondary | ICD-10-CM | POA: Diagnosis not present

## 2023-04-09 DIAGNOSIS — H25812 Combined forms of age-related cataract, left eye: Secondary | ICD-10-CM | POA: Diagnosis not present

## 2023-04-09 DIAGNOSIS — H2512 Age-related nuclear cataract, left eye: Secondary | ICD-10-CM | POA: Diagnosis not present

## 2023-07-06 DIAGNOSIS — R319 Hematuria, unspecified: Secondary | ICD-10-CM | POA: Diagnosis not present

## 2023-07-06 DIAGNOSIS — N390 Urinary tract infection, site not specified: Secondary | ICD-10-CM | POA: Diagnosis not present

## 2023-08-15 ENCOUNTER — Other Ambulatory Visit (HOSPITAL_COMMUNITY): Payer: Self-pay | Admitting: Family Medicine

## 2023-08-15 ENCOUNTER — Ambulatory Visit (HOSPITAL_COMMUNITY)
Admission: RE | Admit: 2023-08-15 | Discharge: 2023-08-15 | Disposition: A | Discharge status: Home / Self Care | Source: Ambulatory Visit | Attending: Vascular Surgery | Admitting: Vascular Surgery

## 2023-08-15 DIAGNOSIS — R600 Localized edema: Secondary | ICD-10-CM | POA: Diagnosis not present

## 2023-08-15 DIAGNOSIS — Z681 Body mass index (BMI) 19 or less, adult: Secondary | ICD-10-CM | POA: Diagnosis not present

## 2023-08-15 DIAGNOSIS — R768 Other specified abnormal immunological findings in serum: Secondary | ICD-10-CM | POA: Diagnosis not present

## 2023-09-26 DIAGNOSIS — N611 Abscess of the breast and nipple: Secondary | ICD-10-CM | POA: Diagnosis not present

## 2023-10-01 DIAGNOSIS — M722 Plantar fascial fibromatosis: Secondary | ICD-10-CM | POA: Diagnosis not present

## 2023-10-01 DIAGNOSIS — E039 Hypothyroidism, unspecified: Secondary | ICD-10-CM | POA: Diagnosis not present

## 2023-10-01 DIAGNOSIS — E782 Mixed hyperlipidemia: Secondary | ICD-10-CM | POA: Diagnosis not present

## 2023-10-01 DIAGNOSIS — M7989 Other specified soft tissue disorders: Secondary | ICD-10-CM | POA: Diagnosis not present

## 2023-10-01 DIAGNOSIS — Z23 Encounter for immunization: Secondary | ICD-10-CM | POA: Diagnosis not present

## 2023-10-01 DIAGNOSIS — Z0000 Encounter for general adult medical examination without abnormal findings: Secondary | ICD-10-CM | POA: Diagnosis not present

## 2023-10-01 DIAGNOSIS — M8580 Other specified disorders of bone density and structure, unspecified site: Secondary | ICD-10-CM | POA: Diagnosis not present

## 2023-10-04 DIAGNOSIS — N611 Abscess of the breast and nipple: Secondary | ICD-10-CM | POA: Diagnosis not present

## 2023-10-05 ENCOUNTER — Other Ambulatory Visit: Payer: Self-pay | Admitting: Obstetrics and Gynecology

## 2023-10-05 DIAGNOSIS — N611 Abscess of the breast and nipple: Secondary | ICD-10-CM

## 2023-10-10 ENCOUNTER — Other Ambulatory Visit: Payer: Self-pay | Admitting: Obstetrics and Gynecology

## 2023-10-10 DIAGNOSIS — N611 Abscess of the breast and nipple: Secondary | ICD-10-CM

## 2023-10-11 ENCOUNTER — Other Ambulatory Visit: Payer: Self-pay | Admitting: Obstetrics and Gynecology

## 2023-10-11 ENCOUNTER — Encounter

## 2023-10-11 ENCOUNTER — Ambulatory Visit
Admission: RE | Admit: 2023-10-11 | Discharge: 2023-10-11 | Disposition: A | Discharge status: Home / Self Care | Source: Ambulatory Visit | Attending: Obstetrics and Gynecology | Admitting: Obstetrics and Gynecology

## 2023-10-11 ENCOUNTER — Other Ambulatory Visit

## 2023-10-11 ENCOUNTER — Ambulatory Visit: Admission: RE | Admit: 2023-10-11 | Source: Ambulatory Visit

## 2023-10-11 DIAGNOSIS — N611 Abscess of the breast and nipple: Secondary | ICD-10-CM

## 2023-10-11 DIAGNOSIS — R928 Other abnormal and inconclusive findings on diagnostic imaging of breast: Secondary | ICD-10-CM | POA: Diagnosis not present

## 2023-10-11 DIAGNOSIS — N6314 Unspecified lump in the right breast, lower inner quadrant: Secondary | ICD-10-CM | POA: Diagnosis not present
